# Patient Record
Sex: Male | Born: 1995 | Race: Black or African American | Hispanic: No | Marital: Single | Smoking: Current every day smoker
Health system: Southern US, Community
[De-identification: ages and names within clinical notes are randomized; demographics above are authoritative.]

## PROBLEM LIST (undated history)

## (undated) DIAGNOSIS — S62309A Unspecified fracture of unspecified metacarpal bone, initial encounter for closed fracture: Secondary | ICD-10-CM

---
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
## 2017-03-13 ENCOUNTER — Emergency Department (HOSPITAL_COMMUNITY): Payer: Self-pay

## 2017-03-13 ENCOUNTER — Emergency Department (HOSPITAL_COMMUNITY)
Admission: EM | Admit: 2017-03-13 | Discharge: 2017-03-13 | Disposition: A | Discharge status: Home / Self Care | Payer: Self-pay | Attending: Emergency Medicine | Admitting: Emergency Medicine

## 2017-03-13 ENCOUNTER — Encounter (HOSPITAL_COMMUNITY): Payer: Self-pay | Admitting: Emergency Medicine

## 2017-03-13 DIAGNOSIS — F17200 Nicotine dependence, unspecified, uncomplicated: Secondary | ICD-10-CM | POA: Insufficient documentation

## 2017-03-13 DIAGNOSIS — R072 Precordial pain: Secondary | ICD-10-CM | POA: Insufficient documentation

## 2017-03-13 LAB — CBC
HEMATOCRIT: 40.9 % (ref 39.0–52.0)
HEMOGLOBIN: 14.2 g/dL (ref 13.0–17.0)
MCH: 30.9 pg (ref 26.0–34.0)
MCHC: 34.7 g/dL (ref 30.0–36.0)
MCV: 89.1 fL (ref 78.0–100.0)
Platelets: 210 10*3/uL (ref 150–400)
RBC: 4.59 MIL/uL (ref 4.22–5.81)
RDW: 12.5 % (ref 11.5–15.5)
WBC: 5.2 10*3/uL (ref 4.0–10.5)

## 2017-03-13 LAB — BASIC METABOLIC PANEL
ANION GAP: 8 (ref 5–15)
BUN: 8 mg/dL (ref 6–20)
CHLORIDE: 106 mmol/L (ref 101–111)
CO2: 26 mmol/L (ref 22–32)
Calcium: 9.2 mg/dL (ref 8.9–10.3)
Creatinine, Ser: 0.93 mg/dL (ref 0.61–1.24)
GFR calc Af Amer: >60 mL/min (ref >60)
GLUCOSE: 86 mg/dL (ref 65–99)
POTASSIUM: 3.5 mmol/L (ref 3.5–5.1)
Sodium: 140 mmol/L (ref 135–145)

## 2017-03-13 LAB — I-STAT TROPONIN, ED: Troponin i, poc: 0 ng/mL (ref 0.00–0.08)
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
## 2017-03-13 NOTE — ED Provider Notes (Signed)
[facility]-EMERGENCY DEPT Provider Note   CSN: [id number] Arrival date & time: 03/13/17  0108     History   Chief Complaint Chief Complaint  Patient presents with  . Chest Pain  . Spasms    HPI Kerry Butler is a 21 y.o. male.  HPI Patient is a 21 year old male presents the emergency department after developing chest tightness tonight.  He states he's had chest tightness most days for the past year and half and is never been seen or evaluated for this.  He reports the pain is sharp and burning sensation in his chest with occasional radiation towards his right arm.  No shortness of breath.  No active chest pain this time.  Denies fevers and chills.  No productive cough.  No other complaints at this time.   Past Medical History:  Diagnosis Date  . Fracture of metacarpal 09/07/2013   right 2nd    There are no active problems to display for this patient.   Past Surgical History:  Procedure Laterality Date  . OPEN REDUCTION INTERNAL FIXATION (ORIF) METACARPAL Right 09/17/2013   Procedure: Open reduction internal fixation right second metacarpal fracture;  Surgeon: Jodi Marble, MD;  Location: [facility];  Service: Orthopedics;  Laterality: Right;       Home Medications    Prior to Admission medications   Medication Sig Start Date End Date Taking? Authorizing Provider  acetaminophen (TYLENOL) 325 MG tablet Take 650 mg by mouth every 6 (six) hours as needed for mild pain.     [provider]  ibuprofen (ADVIL,MOTRIN) 200 MG tablet Take 400 mg by mouth every 6 (six) hours as needed for pain.    [provider]  traMADol (ULTRAM) 50 MG tablet Take 1 tablet (50 mg total) by mouth every 12 (twelve) hours as needed. 10/09/15   Tomasita Crumble, MD    Family History Family History  Problem Relation Age of Onset  . Hypertension Maternal Grandmother     Social History Social History  Substance Use Topics  . Smoking status: Current Every Day  Smoker  . Smokeless tobacco: Never Used  . Alcohol use No     Allergies   Patient has no known allergies.   Review of Systems Review of Systems  All other systems reviewed and are negative.    Physical Exam Updated Vital Signs BP 118/77   Pulse 61   Temp 98.1 F (36.7 C) (Oral)   Resp 20   Ht  (1.778 m)   Wt 73.9 kg (163 lb)   SpO2 98%   BMI 23.39 kg/m   Physical Exam  Constitutional: He is oriented to person, place, and time. He appears well-developed and well-nourished.  HENT:  Head: Normocephalic and atraumatic.  Eyes: EOM are normal.  Neck: Normal range of motion.  Cardiovascular: Normal rate, regular rhythm and normal heart sounds.   Pulmonary/Chest: Effort normal and breath sounds normal. No respiratory distress.  Abdominal: Soft. He exhibits no distension. There is no tenderness.  Musculoskeletal: Normal range of motion.  Neurological: He is alert and oriented to person, place, and time.  Skin: Skin is warm and dry.  Psychiatric: He has a normal mood and affect. Judgment normal.  Nursing note and vitals reviewed.    ED Treatments / Results  Labs (all labs ordered are listed, but only abnormal results are displayed) Labs Reviewed  BASIC METABOLIC PANEL  CBC  I-STAT TROPONIN, ED    EKG  EKG Interpretation  Date/Time:  Sunday March 13 2017 01:22:14 EDT Ventricular Rate:  64 PR Interval:  128 QRS Duration: 82 QT Interval:  356 QTC Calculation: 367 R Axis:   77 Text Interpretation:  Sinus rhythm with marked sinus arrhythmia Otherwise normal ECG No old tracing to compare Confirmed by Dione Booze ([id number]) on 03/13/2017 1:30:11 AM       Radiology Dg Chest 2 View  Result Date: 03/13/2017 CLINICAL DATA:  Right-sided chest pain tonight. Shortness of breath. EXAM: CHEST  2 VIEW COMPARISON:  Radiographs and CT 10/09/2015 FINDINGS: The cardiomediastinal contours are normal. The lungs are clear. Pulmonary vasculature is normal. No  consolidation, pleural effusion, or pneumothorax. No acute osseous abnormalities are seen. IMPRESSION: Normal radiographs of the chest. Electronically Signed   By: Rubye Oaks M.D.   On: 03/13/2017 02:26    Procedures Procedures (including critical care time)  Medications Ordered in ED Medications - No data to display   Initial Impression / Assessment and Plan / ED Course  I have reviewed the triage vital signs and the nursing notes.  Pertinent labs & imaging results that were available during my care of the patient were reviewed by me and considered in my medical decision making (see chart for details).     Patient is overall well-appearing.  Patient is PERC negative.  Doubt PE.  Doubt ACS.  Likely esophageal or gastroesophageal reflux disease related.  Discharge home in good condition.  Primary care follow-up.  Recommended over-the-counter PPI daily for the next 30 days.  He understands return to the ER for new or worsening symptoms  Final Clinical Impressions(s) / ED Diagnoses   Final diagnoses:  Precordial chest pain    New Prescriptions New Prescriptions   No medications on file     Azalia Bilis, MD 03/13/17 0501
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
## 2017-09-16 ENCOUNTER — Emergency Department (HOSPITAL_COMMUNITY)
Admission: EM | Admit: 2017-09-16 | Discharge: 2017-09-16 | Disposition: A | Discharge status: Home / Self Care | Payer: Self-pay | Attending: Emergency Medicine | Admitting: Emergency Medicine

## 2017-09-16 ENCOUNTER — Other Ambulatory Visit: Payer: Self-pay

## 2017-09-16 ENCOUNTER — Encounter (HOSPITAL_COMMUNITY): Payer: Self-pay

## 2017-09-16 DIAGNOSIS — Z5321 Procedure and treatment not carried out due to patient leaving prior to being seen by health care provider: Secondary | ICD-10-CM | POA: Insufficient documentation

## 2017-09-16 DIAGNOSIS — R1110 Vomiting, unspecified: Secondary | ICD-10-CM | POA: Insufficient documentation

## 2017-09-16 DIAGNOSIS — R109 Unspecified abdominal pain: Secondary | ICD-10-CM | POA: Insufficient documentation

## 2017-09-16 LAB — CBC
HEMATOCRIT: 48.6 % (ref 39.0–52.0)
HEMOGLOBIN: 16.4 g/dL (ref 13.0–17.0)
MCH: 31.4 pg (ref 26.0–34.0)
MCHC: 33.7 g/dL (ref 30.0–36.0)
MCV: 92.9 fL (ref 78.0–100.0)
Platelets: 267 10*3/uL (ref 150–400)
RBC: 5.23 MIL/uL (ref 4.22–5.81)
RDW: 12.8 % (ref 11.5–15.5)
WBC: 8.7 10*3/uL (ref 4.0–10.5)

## 2017-09-16 LAB — COMPREHENSIVE METABOLIC PANEL
ALT: 34 U/L (ref 17–63)
ANION GAP: 10 (ref 5–15)
AST: 29 U/L (ref 15–41)
Albumin: 4.5 g/dL (ref 3.5–5.0)
Alkaline Phosphatase: 74 U/L (ref 38–126)
BILIRUBIN TOTAL: 0.8 mg/dL (ref 0.3–1.2)
BUN: 12 mg/dL (ref 6–20)
CO2: 26 mmol/L (ref 22–32)
Calcium: 9.5 mg/dL (ref 8.9–10.3)
Chloride: 103 mmol/L (ref 101–111)
Creatinine, Ser: 0.87 mg/dL (ref 0.61–1.24)
GFR calc Af Amer: >60 mL/min (ref >60)
GLUCOSE: 90 mg/dL (ref 65–99)
POTASSIUM: 3.9 mmol/L (ref 3.5–5.1)
Sodium: 139 mmol/L (ref 135–145)
TOTAL PROTEIN: 8.3 g/dL — AB (ref 6.5–8.1)

## 2017-09-16 LAB — LIPASE, BLOOD: Lipase: 23 U/L (ref 11–51)

## 2017-09-16 NOTE — ED Notes (Signed)
Pt changed his mind about leaving after receiving the automated text from [facility].

## 2017-09-16 NOTE — ED Notes (Signed)
Pt sts to [facility] that the wait is to long and he is going home
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
## 2017-09-16 NOTE — ED Notes (Signed)
Pt called twice from triage with no answer 

## 2018-12-26 ENCOUNTER — Encounter (HOSPITAL_COMMUNITY): Payer: Self-pay | Admitting: Emergency Medicine

## 2018-12-26 ENCOUNTER — Emergency Department (HOSPITAL_COMMUNITY)
Admission: EM | Admit: 2018-12-26 | Discharge: 2018-12-26 | Disposition: A | Discharge status: Home / Self Care | Payer: HRSA Program | Attending: Emergency Medicine | Admitting: Emergency Medicine

## 2018-12-26 ENCOUNTER — Other Ambulatory Visit: Payer: Self-pay

## 2018-12-26 DIAGNOSIS — F17210 Nicotine dependence, cigarettes, uncomplicated: Secondary | ICD-10-CM | POA: Insufficient documentation

## 2018-12-26 DIAGNOSIS — R51 Headache: Secondary | ICD-10-CM | POA: Insufficient documentation

## 2018-12-26 DIAGNOSIS — R112 Nausea with vomiting, unspecified: Secondary | ICD-10-CM | POA: Diagnosis not present

## 2018-12-26 DIAGNOSIS — R197 Diarrhea, unspecified: Secondary | ICD-10-CM | POA: Diagnosis not present

## 2018-12-26 DIAGNOSIS — R1084 Generalized abdominal pain: Secondary | ICD-10-CM | POA: Diagnosis not present

## 2018-12-26 DIAGNOSIS — Z20828 Contact with and (suspected) exposure to other viral communicable diseases: Secondary | ICD-10-CM | POA: Insufficient documentation

## 2018-12-26 DIAGNOSIS — Z20822 Contact with and (suspected) exposure to covid-19: Secondary | ICD-10-CM

## 2018-12-26 LAB — URINALYSIS, ROUTINE W REFLEX MICROSCOPIC
Bilirubin Urine: NEGATIVE
Glucose, UA: NEGATIVE mg/dL
Hgb urine dipstick: NEGATIVE
Ketones, ur: 5 mg/dL — AB
Leukocytes,Ua: NEGATIVE
Nitrite: NEGATIVE
Protein, ur: NEGATIVE mg/dL
Specific Gravity, Urine: 1.015 (ref 1.005–1.030)
pH: 6 (ref 5.0–8.0)

## 2018-12-26 LAB — CBC WITH DIFFERENTIAL/PLATELET
Abs Immature Granulocytes: 0.01 10*3/uL (ref 0.00–0.07)
Basophils Absolute: 0.1 10*3/uL (ref 0.0–0.1)
Basophils Relative: 2 %
Eosinophils Absolute: 0 10*3/uL (ref 0.0–0.5)
Eosinophils Relative: 0 %
HCT: 43 % (ref 39.0–52.0)
Hemoglobin: 14.7 g/dL (ref 13.0–17.0)
Immature Granulocytes: 0 %
Lymphocytes Relative: 44 %
Lymphs Abs: 1.4 10*3/uL (ref 0.7–4.0)
MCH: 31.3 pg (ref 26.0–34.0)
MCHC: 34.2 g/dL (ref 30.0–36.0)
MCV: 91.5 fL (ref 80.0–100.0)
Monocytes Absolute: 0.3 10*3/uL (ref 0.1–1.0)
Monocytes Relative: 9 %
Neutro Abs: 1.4 10*3/uL — ABNORMAL LOW (ref 1.7–7.7)
Neutrophils Relative %: 45 %
Platelets: 226 10*3/uL (ref 150–400)
RBC: 4.7 MIL/uL (ref 4.22–5.81)
RDW: 12.5 % (ref 11.5–15.5)
WBC: 3.1 10*3/uL — ABNORMAL LOW (ref 4.0–10.5)
nRBC: 0 % (ref 0.0–0.2)

## 2018-12-26 LAB — COMPREHENSIVE METABOLIC PANEL
ALT: 18 U/L (ref 0–44)
AST: 30 U/L (ref 15–41)
Albumin: 4.4 g/dL (ref 3.5–5.0)
Alkaline Phosphatase: 48 U/L (ref 38–126)
Anion gap: 9 (ref 5–15)
BUN: 10 mg/dL (ref 6–20)
CO2: 24 mmol/L (ref 22–32)
Calcium: 9.4 mg/dL (ref 8.9–10.3)
Chloride: 103 mmol/L (ref 98–111)
Creatinine, Ser: 0.99 mg/dL (ref 0.61–1.24)
GFR calc Af Amer: >60 mL/min (ref >60)
GFR calc non Af Amer: >60 mL/min (ref >60)
Glucose, Bld: 83 mg/dL (ref 70–99)
Potassium: 4.6 mmol/L (ref 3.5–5.1)
Sodium: 136 mmol/L (ref 135–145)
Total Bilirubin: 2 mg/dL — ABNORMAL HIGH (ref 0.3–1.2)
Total Protein: 7.2 g/dL (ref 6.5–8.1)

## 2018-12-26 LAB — LIPASE, BLOOD: Lipase: 23 U/L (ref 11–51)

## 2018-12-26 MED ORDER — ONDANSETRON 4 MG PO TBDP: 4.0000 mg | ORAL_TABLET | Freq: Three times a day (TID) | ORAL | 0 refills | Status: DC | PRN

## 2018-12-26 NOTE — ED Notes (Signed)
Patient verbalizes understanding of discharge instructions. Opportunity for questioning and answers were provided. Armband removed by staff, pt discharged from ED.  

## 2018-12-26 NOTE — Discharge Instructions (Addendum)
Lab work including electrolytes is normal.   I suspect you have a virus that is causing your symptoms.  We tested your for COVID-19 (coronavirus) infection because this could be the virus causing your symptoms.  It could also be any other virus.  COVID test results come back in 48 hours, sometimes sooner.  Someone will call you to notify you of results.  Until test results come back you need to do strict self isolation at home.    A virus is treated conservatively with symptom control, self-isolation, monitoring of symptoms and supportive care with over-the-counter medicines.  Use ondansetron (Zofran) for nausea. Use over the counter imodium to control and slow down diarrhea. Stay well hydrated.  Avoid irritating foods that cause more nausea, vomiting, diarrhea such as fatty, greasy or fried foods.   Return to the ED if there is increased work of breathing, shortness of breath, inability to tolerate fluids, weakness, chest pain.  If your test results are POSITIVE, the following isolation requirements need to be met to return to work and resume essential activities: At least 14 days since symptom onset  72 hours of absence of fever without antifever medicine (ibuprofen, acetaminophen). A fever is temperature of 100.50F or greater. Improvement of respiratory symptoms  If your test is NEGATIVE, you may return to work and essential activities as long as your symptoms have improved and you do not have a fever for a total of 3 days.  Call your job and notify them that your test result was negative to see if they will allow you to return to work.   Stay well-hydrated. Rest. You can use over the counter medications to help with symptoms: 600 mg ibuprofen (motrin, aleve, advil) or acetaminophen (tylenol) every 6 hours, around the clock to help with associated fevers, sore throat, headaches, generalized body aches and malaise.  Oxymetazoline (afrin) intranasal spray once daily for no more than 3 days to help  with congestion, after 3 days you can switch to another over-the-counter nasal steroid spray such as fluticasone (flonase) Allergy medication (loratadine, cetirizine, etc) and phenylephrine (sudafed) help with nasal congestion, runny nose and postnasal drip.   Dextromethorphan (Delsym) to suppress dry cough. Frequent coughing is likely causing your chest wall pain Wash your hands often to prevent spread.    Infection Prevention Recommendations for Individuals Confirmed to have, or Being Evaluated for, or have symptoms of 2019 Novel Coronavirus (COVID-19) Infection Who Receive Care at Home  Individuals who are confirmed to have, or are being evaluated for, COVID-19 should follow the prevention steps below until a healthcare provider or local or state health department says they can return to normal activities.  Stay home except to get medical care You should restrict activities outside your home, except for getting medical care. Do not go to work, school, or public areas, and do not use public transportation or taxis.  Call ahead before visiting your doctor Before your medical appointment, call the healthcare provider and tell them that you have, or are being evaluated for, COVID-19 infection. This will help the healthcare providers office take steps to keep other people from getting infected. Ask your healthcare provider to call the local or state health department.  Monitor your symptoms Seek prompt medical attention if your illness is worsening (e.g., difficulty breathing). Before going to your medical appointment, call the healthcare provider and tell them that you have, or are being evaluated for, COVID-19 infection. Ask your healthcare provider to call the local or state health  department.  Wear a facemask You should wear a facemask that covers your nose and mouth when you are in the same room with other people and when you visit a healthcare provider. People who live with or visit you  should also wear a facemask while they are in the same room with you.  Separate yourself from other people in your home As much as possible, you should stay in a different room from other people in your home. Also, you should use a separate bathroom, if available.  Avoid sharing household items You should not share dishes, drinking glasses, cups, eating utensils, towels, bedding, or other items with other people in your home. After using these items, you should wash them thoroughly with soap and water.  Cover your coughs and sneezes Cover your mouth and nose with a tissue when you cough or sneeze, or you can cough or sneeze into your sleeve. Throw used tissues in a lined trash can, and immediately wash your hands with soap and water for at least 20 seconds or use an alcohol-based hand rub.  Wash your [facility] your hands often and thoroughly with soap and water for at least 20 seconds. You can use an alcohol-based hand sanitizer if soap and water are not available and if your hands are not visibly dirty. Avoid touching your eyes, nose, and mouth with unwashed hands.   Prevention Steps for Caregivers and Household Members of Individuals Confirmed to have, or Being Evaluated for, or have symptoms of 2019 Novel Coronavirus (COVID-19) Infection Being Cared for in the Home  If you live with, or provide care at home for, a person confirmed to have, or being evaluated for, COVID-19 infection please follow these guidelines to prevent infection:  Follow healthcare providers instructions Make sure that you understand and can help the patient follow any healthcare provider instructions for all care.  Provide for the patients basic needs You should help the patient with basic needs in the home and provide support for getting groceries, prescriptions, and other personal needs.  Monitor the patients symptoms If they are getting sicker, call his or her medical provider and tell them that the patient  has, or is being evaluated for, COVID-19 infection. This will help the healthcare providers office take steps to keep other people from getting infected. Ask the healthcare provider to call the local or state health department.  Limit the number of people who have contact with the patient If possible, have only one caregiver for the patient. Other household members should stay in another home or place of residence. If this is not possible, they should stay in another room, or be separated from the patient as much as possible. Use a separate bathroom, if available. Restrict visitors who do not have an essential need to be in the home.  Keep older adults, very young children, and other sick people away from the patient Keep older adults, very young children, and those who have compromised immune systems or chronic health conditions away from the patient. This includes people with chronic heart, lung, or kidney conditions, diabetes, and cancer.  Ensure good ventilation Make sure that shared spaces in the home have good air flow, such as from an air conditioner or an opened window, weather permitting.  Wash your hands often Wash your hands often and thoroughly with soap and water for at least 20 seconds. You can use an alcohol based hand sanitizer if soap and water are not available and if your hands are  not visibly dirty. Avoid touching your eyes, nose, and mouth with unwashed hands. Use disposable paper towels to dry your hands. If not available, use dedicated cloth towels and replace them when they become wet.  Wear a facemask and gloves Wear a disposable facemask at all times in the room and gloves when you touch or have contact with the patients blood, body fluids, and/or secretions or excretions, such as sweat, saliva, sputum, nasal mucus, vomit, urine, or feces.  Ensure the mask fits over your nose and mouth tightly, and do not touch it during use. Throw out disposable facemasks and gloves  after using them. Do not reuse. Wash your hands immediately after removing your facemask and gloves. If your personal clothing becomes contaminated, carefully remove clothing and launder. Wash your hands after handling contaminated clothing. Place all used disposable facemasks, gloves, and other waste in a lined container before disposing them with other household waste. Remove gloves and wash your hands immediately after handling these items.  Do not share dishes, glasses, or other household items with the patient Avoid sharing household items. You should not share dishes, drinking glasses, cups, eating utensils, towels, bedding, or other items with a patient who is confirmed to have, or being evaluated for, COVID-19 infection. After the person uses these items, you should wash them thoroughly with soap and water.  Wash laundry thoroughly Immediately remove and wash clothes or bedding that have blood, body fluids, and/or secretions or excretions, such as sweat, saliva, sputum, nasal mucus, vomit, urine, or feces, on them. Wear gloves when handling laundry from the patient. Read and follow directions on labels of laundry or clothing items and detergent. In general, wash and dry with the warmest temperatures recommended on the label.  Clean all areas the individual has used often Clean all touchable surfaces, such as counters, tabletops, doorknobs, bathroom fixtures, toilets, phones, keyboards, tablets, and bedside tables, every day. Also, clean any surfaces that may have blood, body fluids, and/or secretions or excretions on them. Wear gloves when cleaning surfaces the patient has come in contact with. Use a diluted bleach solution (e.g., dilute bleach with 1 part bleach and 10 parts water) or a household disinfectant with a label that says EPA-registered for coronaviruses. To make a bleach solution at home, add 1 tablespoon of bleach to 1 quart (4 cups) of water. For a larger supply, add  cup of  bleach to 1 gallon (16 cups) of water. Read labels of cleaning products and follow recommendations provided on product labels. Labels contain instructions for safe and effective use of the cleaning product including precautions you should take when applying the product, such as wearing gloves or eye protection and making sure you have good ventilation during use of the product. Remove gloves and wash hands immediately after cleaning.  Monitor yourself for signs and symptoms of illness Caregivers and household members are considered close contacts, should monitor their health, and will be asked to limit movement outside of the home to the extent possible. Follow the monitoring steps for close contacts listed on the symptom monitoring form.  ? If you have additional questions, contact your local health department or call the epidemiologist on call at [phone] (available 24/7). ? This guidance is subject to change. For the most up-to-date guidance from [facility], please refer to their website: [email or web address]

## 2018-12-26 NOTE — ED Triage Notes (Signed)
Pt arrives to ED from home with complaints of nausea, diarrhea, headache and fever for the last week. Pt has come into the ED today for a COVID test for work.

## 2018-12-26 NOTE — ED Provider Notes (Signed)
[facility] Provider Note   CSN: [id number] Arrival date & time: 12/26/18  1029    History   Chief Complaint Chief Complaint  Patient presents with  . Headache  . Diarrhea    HPI Kerry Butler is a 23 y.o. male with no PMH presents to the ER for evaluation of diarrhea. Onset 7 days ago.  Described as loose, watery, nonbloody, non-melanotic.  He is having 4-6 episodes daily.  States he drank 14 cups of water today and he just pooped it out.  Initially he had associated generalized abdominal cramping which has resolved.  For the last week he has had fever of 101, intermittent generalized mild headaches, rhinorrhea that he attributes to wearing a mask, nausea and vomiting that began today.  He is only vomited once, nonbloody, no coffee-ground emesis.  He lives with his mother and mother's husband who are well and have no symptoms.  He has 2 jobs.  He works at a [employer] and also at [employer] and states that they get a lot of shipment from [place].  Over the last few weeks he has been advised that some coworkers have been tested positive for COVID-19.  He has continued to go to work despite symptoms.  He is fasting for religious reasons for the last [id number], and breaks fast at 8 PM.  He denies any significant diet changes otherwise.  No recent travel.  No recent antibiotics.  No exposure to suspicious, undercooked foods.  No interventions other than trying to stay hydrated.  He has been able to drink water today without vomiting.  No history of abdominal surgeries.  He denies congestion, sore throat, cough, chest pain, shortness of breath, abdominal pain, dysuria, loss of smell or taste.     HPI  Past Medical History:  Diagnosis Date  . Fracture of metacarpal 09/07/2013   right 2nd    There are no active problems to display for this patient.   Past Surgical History:  Procedure Laterality Date  . OPEN REDUCTION INTERNAL FIXATION  (ORIF) METACARPAL Right 09/17/2013   Procedure: Open reduction internal fixation right second metacarpal fracture;  Surgeon: Jodi Marbleavid A Thompson, MD;  Location: [facility];  Service: Orthopedics;  Laterality: Right;        Home Medications    Prior to Admission medications   Medication Sig Start Date End Date Taking? Authorizing Provider  acetaminophen (TYLENOL) 325 MG tablet Take 650 mg by mouth every 6 (six) hours as needed for mild pain.     [provider]  ibuprofen (ADVIL,MOTRIN) 200 MG tablet Take 400 mg by mouth every 6 (six) hours as needed for pain.    [provider]  ondansetron (ZOFRAN ODT) 4 MG disintegrating tablet Take 1 tablet (4 mg total) by mouth every 8 (eight) hours as needed for nausea or vomiting. 12/26/18   Liberty HandyGibbons, Emali Heyward J, PA-C  traMADol (ULTRAM) 50 MG tablet Take 1 tablet (50 mg total) by mouth every 12 (twelve) hours as needed. 10/09/15   Tomasita Crumbleni, Adeleke, MD    Family History Family History  Problem Relation Age of Onset  . Hypertension Maternal Grandmother     Social History Social History   Tobacco Use  . Smoking status: Current Every Day Smoker    Packs/day: 0.15    Types: Cigarettes  . Smokeless tobacco: Never Used  Substance Use Topics  . Alcohol use: No  . Drug use: Not Currently  Types: Marijuana     Allergies   Patient has no known allergies.   Review of Systems Review of Systems  Constitutional: Positive for fever.  HENT: Positive for rhinorrhea.   Gastrointestinal: Positive for abdominal pain (resolved), diarrhea, nausea and vomiting (x1 ).  Neurological: Positive for headaches.  All other systems reviewed and are negative.    Physical Exam Updated Vital Signs BP 115/72 (BP Location: Right Arm)   Pulse (!) 55   Temp 98.4 F (36.9 C) (Oral)   Resp 16   SpO2 100%   Physical Exam Vitals signs and nursing note reviewed.  Constitutional:      Appearance: He is well-developed.     Comments:  Non toxic.  HENT:     Head: Normocephalic and atraumatic.     Nose: Nose normal.     Comments: No nasal mucosal congestion, erythema, edema, rhinorrhea.    Mouth/Throat:     Comments: Moist mucous membranes.  Oropharynx and tonsils normal. Eyes:     Conjunctiva/sclera: Conjunctivae normal.  Neck:     Musculoskeletal: Normal range of motion.  Cardiovascular:     Rate and Rhythm: Normal rate and regular rhythm.     Heart sounds: Normal heart sounds.  Pulmonary:     Effort: Pulmonary effort is normal.     Breath sounds: Normal breath sounds.  Abdominal:     General: Bowel sounds are normal.     Palpations: Abdomen is soft.     Tenderness: There is no abdominal tenderness.     Comments: No G/R/R. No suprapubic or CVA tenderness. Negative Murphy's and McBurney's  Musculoskeletal: Normal range of motion.  Skin:    General: Skin is warm and dry.     Capillary Refill: Capillary refill takes less than 2 seconds.  Neurological:     Mental Status: He is alert.  Psychiatric:        Behavior: Behavior normal.      ED Treatments / Results  Labs (all labs ordered are listed, but only abnormal results are displayed) Labs Reviewed  CBC WITH DIFFERENTIAL/PLATELET - Abnormal; Notable for the following components:      Result Value   WBC 3.1 (*)    Neutro Abs 1.4 (*)    All other components within normal limits  COMPREHENSIVE METABOLIC PANEL - Abnormal; Notable for the following components:   Total Bilirubin 2.0 (*)    All other components within normal limits  URINALYSIS, ROUTINE W REFLEX MICROSCOPIC - Abnormal; Notable for the following components:   Ketones, ur 5 (*)    All other components within normal limits  NOVEL CORONAVIRUS, NAA (HOSPITAL ORDER, SEND-OUT TO REF LAB)  LIPASE, BLOOD    EKG None  Radiology No results found.  Procedures Procedures (including critical care time)  Medications Ordered in ED Medications - No data to display   Initial Impression /  Assessment and Plan / ED Course  I have reviewed the triage vital signs and the nursing notes.  Pertinent labs & imaging results that were available during my care of the patient were reviewed by me and considered in my medical decision making (see chart for details).  I reviewed patient's EMR to obtain pertinent PMH.  DDX includes viral illness vs infectious diarrhea.  States he works at a [employer] that gets a lot of shipments from [place], [place] and has had several coworkers test positive for COVID-19 so COVID-19 high on ddx.  Given his work setting, known sick contacts, I think it  will be beneficial to do 2 day send out COVID test today. He has no fever here, abdominal tenderness, signs of significant dehydration and acute intraabdominal emergency like appendicitis, cholecystitis, pancreatitis, SBO less likely. He has no h/o abdominal surgeries. Abd exam benign here. Given 7 days of diarrhea, will check screening labs to rule out significant electrolyte abnormalities. Tolerating PO, MMM, we will hydrate orally. Pending labs.   Lab works reviewed and interpreted by me. 5 ketones in urine likely from mild dehydration. I have re-evaluated patient. He is tolerating PO challenge here. No episodes of emesis or diarrhea in ER. VSS.   Will dc with supportive tx for likely viral illness, pending COVID test.  Discussed isolation instructions, supportive care at home and return precautions. Pt comfortable with ER work up and discharge plan.   Final Clinical Impressions(s) / ED Diagnoses   Final diagnoses:  Diarrhea of presumed infectious origin  Suspected Covid-19 Virus Infection    ED Discharge Orders         Ordered    ondansetron (ZOFRAN ODT) 4 MG disintegrating tablet  Every 8 hours PRN     12/26/18 1134           Jerrell MylarGibbons, Kanon Novosel J, PA-C 12/26/18 1303    Linwood DibblesKnapp, Jon, MD 12/28/18 [phone]

## 2018-12-27 LAB — NOVEL CORONAVIRUS, NAA (HOSP ORDER, SEND-OUT TO REF LAB; TAT 18-24 HRS): SARS-CoV-2, NAA: NOT DETECTED

## 2019-03-18 ENCOUNTER — Emergency Department (HOSPITAL_COMMUNITY)
Admission: EM | Admit: 2019-03-18 | Discharge: 2019-03-18 | Disposition: A | Discharge status: Home / Self Care | Payer: Self-pay | Attending: Emergency Medicine | Admitting: Emergency Medicine

## 2019-03-18 ENCOUNTER — Encounter (HOSPITAL_COMMUNITY): Payer: Self-pay

## 2019-03-18 ENCOUNTER — Other Ambulatory Visit: Payer: Self-pay

## 2019-03-18 DIAGNOSIS — Z20828 Contact with and (suspected) exposure to other viral communicable diseases: Secondary | ICD-10-CM | POA: Insufficient documentation

## 2019-03-18 DIAGNOSIS — R059 Cough, unspecified: Secondary | ICD-10-CM

## 2019-03-18 DIAGNOSIS — F17210 Nicotine dependence, cigarettes, uncomplicated: Secondary | ICD-10-CM | POA: Insufficient documentation

## 2019-03-18 DIAGNOSIS — R0981 Nasal congestion: Secondary | ICD-10-CM | POA: Insufficient documentation

## 2019-03-18 DIAGNOSIS — M7918 Myalgia, other site: Secondary | ICD-10-CM | POA: Insufficient documentation

## 2019-03-18 DIAGNOSIS — R05 Cough: Secondary | ICD-10-CM | POA: Insufficient documentation

## 2019-03-18 DIAGNOSIS — J029 Acute pharyngitis, unspecified: Secondary | ICD-10-CM | POA: Insufficient documentation

## 2019-03-18 MED ORDER — FLUTICASONE PROPIONATE 50 MCG/ACT NA SUSP: 2.0000 | Freq: Every day | NASAL | 0 refills | Status: DC

## 2019-03-18 MED ORDER — ACETAMINOPHEN 500 MG PO TABS: 500.0000 mg | ORAL_TABLET | Freq: Four times a day (QID) | ORAL | 0 refills | Status: DC | PRN

## 2019-03-18 MED ORDER — PHENOL 1.4 % MT LIQD: 1.0000 | OROMUCOSAL | 0 refills | Status: DC | PRN

## 2019-03-18 NOTE — ED Triage Notes (Signed)
Patient complains of 1 day of cough, congestion and headache, alert and oriented, NAD

## 2019-03-18 NOTE — ED Notes (Signed)
Patient verbalizes understanding of discharge instructions. Opportunity for questioning and answers were provided. Armband removed by staff, pt discharged from ED. Ambulated out to lobby  

## 2019-03-18 NOTE — Discharge Instructions (Signed)
Continue to stay well-hydrated.  Drink plenty fluids and get plenty of rest.  Gargle warm salt water and spit it out for sore throat. May also use cough drops, warm teas, Chloraseptic spray, etc. Take fluticasone to decrease nasal congestion. You can take 1 to 2 tablets of Tylenol (350mg -1000mg  depending on the dose) every 6 hours as needed for pain or fever.  Do not exceed 4000 mg of Tylenol daily.  If your pain persists you can take a doses of ibuprofen in between doses of Tylenol.  I usually recommend 400 to 600 mg of ibuprofen every 6 hours.  Take this with food to avoid upset stomach issues.   Followup with your primary care doctor in 5-7 days for recheck of ongoing symptoms. Return to emergency department for emergent changing or worsening of symptoms such as throat tightness, facial swelling, fever not controlled by ibuprofen or Tylenol,difficulty breathing, or chest pain.  Call the number on the back of your insurance card or go online to see which doctors are in your network and taking new patients and set up an appointment by calling their offices.  Your COVID test should result in the next 48 hours or so.  If negative, call your employer for further recommendations on when to return to work.  If positive, you will need to stay at home for at least 10 days since symptom onset at least 3 days fever free without the use of ibuprofen or Tylenol.

## 2019-03-18 NOTE — ED Provider Notes (Signed)
[facility] ([facility]) EMERGENCY DEPARTMENT Provider Note   CSN: [id number] Arrival date & time: 03/18/19  0907     History   Chief Complaint No chief complaint on file.   HPI Kerry Butler is Butler 23 y.o. male presents for evaluation of acute onset, persistent nasal congestion, cough, generalized myalgias, sore throat for 1 day.  Symptoms began yesterday.  Reports sore throat has improved.  Denies drooling.  Cough productive of clear sputum.  No fevers at home.  Denies shortness of breath.  Notes aching pain with cough to the chest but otherwise no chest pain.  No abdominal pain, nausea, or vomiting.  Has been taking over-the-counter medications with some relief.  Has had many positive COVID exposures at work though reports that they have all been wearing the appropriate PPE.  Current smoker of Butler few cigarettes daily.     The history is provided by the patient.    Past Medical History:  Diagnosis Date  . Fracture of metacarpal 09/07/2013   right 2nd    There are no active problems to display for this patient.   Past Surgical History:  Procedure Laterality Date  . OPEN REDUCTION INTERNAL FIXATION (ORIF) METACARPAL Right 09/17/2013   Procedure: Open reduction internal fixation right second metacarpal fracture;  Surgeon: Kerry Marble, MD;  Location: [facility];  Service: Orthopedics;  Laterality: Right;        Home Medications    Prior to Admission medications   Medication Sig Start Date End Date Taking? Authorizing Provider  acetaminophen (TYLENOL) 500 MG tablet Take 1 tablet (500 mg total) by mouth every 6 (six) hours as needed. 03/18/19   Kerry Sher A, PA-C  fluticasone (FLONASE) 50 MCG/ACT nasal spray Place 2 sprays into both nostrils daily. 03/18/19   Kerry Wickes A, PA-C  ibuprofen (ADVIL,MOTRIN) 200 MG tablet Take 400 mg by mouth every 6 (six) hours as needed for pain.    [provider]  ondansetron (ZOFRAN ODT) 4 MG  disintegrating tablet Take 1 tablet (4 mg total) by mouth every 8 (eight) hours as needed for nausea or vomiting. 12/26/18   Kerry Handy, PA-C  phenol (CHLORASEPTIC) 1.4 % LIQD Use as directed 1 spray in the mouth or throat as needed for throat irritation / pain. 03/18/19   Kerry Giammona A, PA-C  traMADol (ULTRAM) 50 MG tablet Take 1 tablet (50 mg total) by mouth every 12 (twelve) hours as needed. 10/09/15   Kerry Crumble, MD    Family History Family History  Problem Relation Age of Onset  . Hypertension Maternal Grandmother     Social History Social History   Tobacco Use  . Smoking status: Current Every Day Smoker    Packs/day: 0.15    Types: Cigarettes  . Smokeless tobacco: Never Used  Substance Use Topics  . Alcohol use: No  . Drug use: Not Currently    Types: Marijuana     Allergies   Patient has no known allergies.   Review of Systems Review of Systems  Constitutional: Negative for chills and fever.  HENT: Positive for congestion and sore throat.   Respiratory: Positive for cough. Negative for shortness of breath.   Cardiovascular: Positive for chest pain (with cough only).  Musculoskeletal: Positive for myalgias.  All other systems reviewed and are negative.    Physical Exam Updated Vital Signs BP 113/71 (BP Location: Right Arm)   Pulse 82   Temp 98.7 F (37.1 C) (Oral)  Resp 16   Ht 5\' 10"  (1.778 m)   Wt 68 kg   SpO2 97%   BMI 21.52 kg/m   Physical Exam Vitals signs and nursing note reviewed.  Constitutional:      General: He is not in acute distress.    Appearance: He is well-developed.  HENT:     Head: Normocephalic and atraumatic.     Nose: Congestion present.     Comments: No frontal or maxillary sinus tenderness    Mouth/Throat:     Mouth: Mucous membranes are moist.     Pharynx: Posterior oropharyngeal erythema present.     Comments: Mild posterior oropharyngeal erythema.  No tonsillar hypertrophy, exudates, uvular deviation, trismus,  or sublingual abnormalities.  Tolerating secretions without difficulty. Eyes:     General:        Right eye: No discharge.        Left eye: No discharge.     Conjunctiva/sclera: Conjunctivae normal.  Neck:     Musculoskeletal: Normal range of motion and neck supple. No neck rigidity.     Vascular: No JVD.     Trachea: No tracheal deviation.  Cardiovascular:     Rate and Rhythm: Normal rate and regular rhythm.     Pulses: Normal pulses.     Heart sounds: Normal heart sounds.  Pulmonary:     Effort: Pulmonary effort is normal.     Breath sounds: Normal breath sounds.     Comments: Speaking in full sentences without difficulty. Abdominal:     General: There is no distension.     Tenderness: There is no abdominal tenderness. There is no guarding or rebound.  Lymphadenopathy:     Cervical: No cervical adenopathy.  Skin:    General: Skin is warm and dry.     Findings: No erythema.  Neurological:     Mental Status: He is alert.  Psychiatric:        Behavior: Behavior normal.      ED Treatments / Results  Labs (all labs ordered are listed, but only abnormal results are displayed) Labs Reviewed  NOVEL CORONAVIRUS, NAA (HOSP ORDER, SEND-OUT TO REF LAB; TAT 18-24 HRS)    EKG None  Radiology No results found.  Procedures Procedures (including critical care time)  Medications Ordered in ED Medications - No data to display   Initial Impression / Assessment and Plan / ED Course  I have reviewed the triage vital signs and the nursing notes.  Pertinent labs & imaging results that were available during my care of the patient were reviewed by me and considered in my medical decision making (see chart for details).         Kerry Butler was evaluated in Emergency Department on 03/18/2019 for the symptoms described in the history of present illness. He was evaluated in the context of the global COVID-19 pandemic, which necessitated consideration that the patient  might be at risk for infection with the SARS-CoV-2 virus that causes COVID-19. Institutional protocols and algorithms that pertain to the evaluation of patients at risk for COVID-19 are in [place] state of rapid change based on information released by regulatory bodies including the CDC and federal and state organizations. These policies and algorithms were followed during the patient's care in the ED.  Patient presenting for evaluation of viral type symptoms since yesterday.  He is afebrile, vital signs are stable.  He is nontoxic in appearance.  Lungs clear to auscultation bilaterally with no adventitious breath sounds and he  has no signs of respiratory distress.  No evidence of strep pharyngitis, retropharyngeal abscess, or deep space neck infection.  No meningeal signs.  Overall resting comfortably in no apparent distress.  Will obtain outpatient COVID swab.  Suspect viral URI with cough.  Discussed symptomatic management and recommendations to quarantine at home per current CDC guidelines.  Recommend follow with PCP for reevaluation of symptoms.  Discussed strict ED return precautions. Patient and father verbalized understanding of and agreement with plan and is safe for discharge home at this time.    Final Clinical Impressions(s) / ED Diagnoses   Final diagnoses:  Nasal congestion  Acute sore throat  Cough    ED Discharge Orders         Ordered    fluticasone (FLONASE) 50 MCG/ACT nasal spray  Daily     03/18/19 1211    phenol (CHLORASEPTIC) 1.4 % LIQD  As needed     03/18/19 1211    acetaminophen (TYLENOL) 500 MG tablet  Every 6 hours PRN     03/18/19 1211           Jeanie SewerFawze, Journee Kohen A, PA-C 03/18/19 1213    Sabas SousBero, Michael M, MD 03/23/19 1558

## 2019-03-19 LAB — NOVEL CORONAVIRUS, NAA (HOSP ORDER, SEND-OUT TO REF LAB; TAT 18-24 HRS): SARS-CoV-2, NAA: NOT DETECTED

## 2020-04-11 ENCOUNTER — Encounter (HOSPITAL_COMMUNITY): Payer: Self-pay

## 2020-04-11 ENCOUNTER — Ambulatory Visit (HOSPITAL_COMMUNITY)
Admission: EM | Admit: 2020-04-11 | Discharge: 2020-04-11 | Disposition: A | Discharge status: Home / Self Care | Payer: HRSA Program | Attending: Family Medicine | Admitting: Family Medicine

## 2020-04-11 ENCOUNTER — Other Ambulatory Visit: Payer: Self-pay

## 2020-04-11 DIAGNOSIS — U071 COVID-19: Secondary | ICD-10-CM | POA: Diagnosis not present

## 2020-04-11 DIAGNOSIS — J00 Acute nasopharyngitis [common cold]: Secondary | ICD-10-CM | POA: Insufficient documentation

## 2020-04-11 LAB — RESPIRATORY PANEL BY RT PCR (FLU A&B, COVID)
Influenza A by PCR: NEGATIVE
Influenza B by PCR: NEGATIVE
SARS Coronavirus 2 by RT PCR: POSITIVE — AB

## 2020-04-11 NOTE — ED Triage Notes (Signed)
Pt present cough, chills with loss of taste. Symptoms started on Tuesday.

## 2020-04-11 NOTE — ED Provider Notes (Signed)
[facility]    CSN: [id number] Arrival date & time: 04/11/20  1806    History   Chief Complaint Chief Complaint  Patient presents with  . Chills  . Cough    HPI Kerry Butler is a very pleasant 24 y.o. male presenting to urgent care with reports that since Monday he has had a headache, body aches, cough, chills, change in taste, and diarrhea.  He also reports fatigue, stuffy nose, runny nose.  Denies any nausea, vomiting, shortness of breath, sore throat, and rashes.  He is unvaccinated against COVID.  HPI  Past Medical History:  Diagnosis Date  . Fracture of metacarpal 09/07/2013   right 2nd    There are no problems to display for this patient.   Past Surgical History:  Procedure Laterality Date  . OPEN REDUCTION INTERNAL FIXATION (ORIF) METACARPAL Right 09/17/2013   Procedure: Open reduction internal fixation right second metacarpal fracture;  Surgeon: Jodi Marble, MD;  Location: [facility];  Service: Orthopedics;  Laterality: Right;    Home Medications    Prior to Admission medications   Medication Sig Start Date End Date Taking? Authorizing Provider  acetaminophen (TYLENOL) 500 MG tablet Take 1 tablet (500 mg total) by mouth every 6 (six) hours as needed. 03/18/19   Fawze, Mina A, PA-C  fluticasone (FLONASE) 50 MCG/ACT nasal spray Place 2 sprays into both nostrils daily. 03/18/19   Fawze, Mina A, PA-C  ibuprofen (ADVIL,MOTRIN) 200 MG tablet Take 400 mg by mouth every 6 (six) hours as needed for pain.    [provider]  ondansetron (ZOFRAN ODT) 4 MG disintegrating tablet Take 1 tablet (4 mg total) by mouth every 8 (eight) hours as needed for nausea or vomiting. 12/26/18   Liberty Handy, PA-C  phenol (CHLORASEPTIC) 1.4 % LIQD Use as directed 1 spray in the mouth or throat as needed for throat irritation / pain. 03/18/19   Fawze, Mina A, PA-C  traMADol (ULTRAM) 50 MG tablet Take 1 tablet (50 mg total) by mouth every 12  (twelve) hours as needed. 10/09/15   Tomasita Crumble, MD    Family History Family History  Problem Relation Age of Onset  . Hypertension Maternal Grandmother     Social History Social History   Tobacco Use  . Smoking status: Current Every Day Smoker    Packs/day: 0.15    Types: Cigarettes  . Smokeless tobacco: Never Used  Vaping Use  . Vaping Use: Never used  Substance Use Topics  . Alcohol use: No  . Drug use: Not Currently    Types: Marijuana     Allergies   Patient has no known allergies.   Review of Systems Review of Systems   Physical Exam Triage Vital Signs ED Triage Vitals  Enc Vitals Group     BP 04/11/20 1904 (!) 117/58     Pulse Rate 04/11/20 1904 77     Resp 04/11/20 1904 16     Temp 04/11/20 1904 99 F (37.2 C)     Temp Source 04/11/20 1904 Oral     SpO2 04/11/20 1904 99 %     Weight --      Height --      Head Circumference --      Peak Flow --      Pain Score 04/11/20 1902 0     Pain Loc --      Pain Edu? --      Excl. in GC? --  No data found.  Updated Vital Signs BP (!) 117/58 (BP Location: Right Arm)   Pulse 77   Temp 99 F (37.2 C) (Oral)   Resp 16   SpO2 99%   Physical exam: General: appears fatigued, slightly uncomfortable due to chills, body aches, nontoxic appearing HEENT: erythematous and edematous nasal turbinates, mild pharyngitis, mild LAD, no pharyngeal or tonsillar exudates Respiratory: CTA bilaterally, moving air well Cardio: RRR S1S2 present, no murmurs appreciated Abdomen: normal bowel sounds appreciated Integumentary: no rashes appreciated   UC Treatments / Results  Labs (all labs ordered are listed, but only abnormal results are displayed) Labs Reviewed - No data to display  EKG   Radiology No results found.  Procedures Procedures (including critical care time)  Medications Ordered in UC Medications - No data to display  Initial Impression / Assessment and Plan / UC Course  I have reviewed the  triage vital signs and the nursing notes.  Pertinent labs & imaging results that were available during my care of the patient were reviewed by me and considered in my medical decision making (see chart for details).   COVID Positive: patient swabbed for COVID, Flu A and Flu B, ended up testing positive for COVID. Mild symptoms, satting well on Room Air. Patient is stable to discharge home with symptomatic care. Patient given the following instructions: 1. Use supportive treatment at home with Tylenol, Ibuprofen, hot tea, Mucinex, etc.  2. IF you develop worsening symptoms such as fevers that do not reduce with tylenol or shortness of breath, please go seek emergency medical care.  3. Please follow up with your primary care provider for this visit.  4. Please get your COVID and Flu vaccines as soon as possible  Final Clinical Impressions(s) / UC Diagnoses   Final diagnoses:  None   Discharge Instructions   None    ED Prescriptions    None     PDMP not reviewed this encounter.     Peggyann Shoals, DO [facility], PGY-3 04/11/2020 8:10 PM    Dollene Cleveland, DO 04/14/20 [phone]

## 2020-04-11 NOTE — Discharge Instructions (Addendum)
Thank you for coming in to see [place] today! Please see below to review our plan for today's visit:  1. Use supportive treatment at home with Tylenol, Ibuprofen, hot tea, Mucinex, etc.  2. IF you develop worsening symptoms such as fevers that do not reduce with tylenol or shortness of breath, please go seek emergency medical care.  3. Please follow up with your primary care provider for this visit.   Please get your COVID and Flu vaccines as soon as possible!!!  Please look up Dr. Pincus Badder with [facility] (chiropractor)  It was our pleasure to serve you!   Dr. Peggyann Shoals [facility]

## 2020-04-14 DIAGNOSIS — U071 COVID-19: Secondary | ICD-10-CM | POA: Diagnosis present

## 2022-06-05 ENCOUNTER — Ambulatory Visit (HOSPITAL_COMMUNITY)
Admission: EM | Admit: 2022-06-05 | Discharge: 2022-06-05 | Disposition: A | Discharge status: Home / Self Care | Payer: Self-pay

## 2022-06-05 ENCOUNTER — Ambulatory Visit
Admission: EM | Admit: 2022-06-05 | Discharge: 2022-06-05 | Disposition: A | Discharge status: Home / Self Care | Payer: Medicaid Other | Attending: Internal Medicine | Admitting: Internal Medicine

## 2022-06-05 DIAGNOSIS — J039 Acute tonsillitis, unspecified: Secondary | ICD-10-CM | POA: Diagnosis not present

## 2022-06-05 DIAGNOSIS — Z113 Encounter for screening for infections with a predominantly sexual mode of transmission: Secondary | ICD-10-CM | POA: Diagnosis not present

## 2022-06-05 LAB — POCT RAPID STREP A (OFFICE): Rapid Strep A Screen: NEGATIVE

## 2022-06-05 MED ORDER — CLINDAMYCIN HCL 300 MG PO CAPS: 300.0000 mg | ORAL_CAPSULE | Freq: Three times a day (TID) | ORAL | 0 refills | Status: AC

## 2022-06-05 NOTE — ED Triage Notes (Addendum)
Pt c/o pain to tongue, right ear, right side of neck x 3 days-pt also requesting STD screening-denies penile d/c and dysuria -NAD-steady gait

## 2022-06-05 NOTE — Discharge Instructions (Signed)
Take medication as prescribed  Lab results will be available via mychart in 2 - 5 days

## 2022-06-05 NOTE — ED Provider Notes (Signed)
[facility]    CSN: [id number] Arrival date & time: 06/05/22  1419      History   Chief Complaint No chief complaint on file.   HPI Kerry Butler is a 26 y.o. male.   Patient here concerned with sore throat x 2 days. Pain worsening, R side posterior, now radiation to ear. Some pain with swallowing.  No f/c, URI sx, cough.  No advil or tylenol today.  He is also requesting STD testing, he states someone he was with reports she had CT. He deneis any sx.    Past Medical History:  Diagnosis Date   Fracture of metacarpal 09/07/2013   right 2nd    Patient Active Problem List   Diagnosis Date Noted   Lab test positive for detection of COVID-19 virus 04/14/2020   Acute nasopharyngitis 04/11/2020    Past Surgical History:  Procedure Laterality Date   OPEN REDUCTION INTERNAL FIXATION (ORIF) METACARPAL Right 09/17/2013   Procedure: Open reduction internal fixation right second metacarpal fracture;  Surgeon: Jodi Marble, MD;  Location: [facility];  Service: Orthopedics;  Laterality: Right;       Home Medications    Prior to Admission medications   Medication Sig Start Date End Date Taking? Authorizing Provider  clindamycin (CLEOCIN) 300 MG capsule Take 1 capsule (300 mg total) by mouth 3 (three) times daily for 7 days. 06/05/22 06/12/22 Yes Evern Core, PA-C  acetaminophen (TYLENOL) 500 MG tablet Take 1 tablet (500 mg total) by mouth every 6 (six) hours as needed. 03/18/19   Fawze, Mina A, PA-C  fluticasone (FLONASE) 50 MCG/ACT nasal spray Place 2 sprays into both nostrils daily. 03/18/19   Fawze, Mina A, PA-C  ibuprofen (ADVIL,MOTRIN) 200 MG tablet Take 400 mg by mouth every 6 (six) hours as needed for pain.    [provider]  ondansetron (ZOFRAN ODT) 4 MG disintegrating tablet Take 1 tablet (4 mg total) by mouth every 8 (eight) hours as needed for nausea or vomiting. 12/26/18   Liberty Handy, PA-C  phenol (CHLORASEPTIC) 1.4  % LIQD Use as directed 1 spray in the mouth or throat as needed for throat irritation / pain. 03/18/19   Fawze, Mina A, PA-C  traMADol (ULTRAM) 50 MG tablet Take 1 tablet (50 mg total) by mouth every 12 (twelve) hours as needed. 10/09/15   Tomasita Crumble, MD    Family History Family History  Problem Relation Age of Onset   Hypertension Maternal Grandmother     Social History Social History   Tobacco Use   Smoking status: Every Day    Packs/day: 0.15    Types: Cigarettes   Smokeless tobacco: Never  Vaping Use   Vaping Use: Never used  Substance Use Topics   Alcohol use: No   Drug use: Yes    Types: Marijuana     Allergies   Patient has no known allergies.   Review of Systems Review of Systems  Constitutional:  Negative for chills and fever.  HENT:  Positive for ear pain, sore throat and trouble swallowing. Negative for congestion, dental problem, drooling, mouth sores, rhinorrhea, sinus pain and voice change.   Eyes:  Negative for pain and visual disturbance.  Respiratory:  Negative for cough, shortness of breath and wheezing.   Cardiovascular:  Negative for chest pain and palpitations.  Gastrointestinal:  Negative for abdominal pain, diarrhea, nausea and vomiting.  Genitourinary:  Negative for decreased urine volume, dysuria, hematuria, penile discharge, penile pain, penile  swelling, testicular pain and urgency.  Musculoskeletal:  Negative for arthralgias and back pain.  Skin:  Negative for color change and rash.  Neurological:  Negative for seizures and syncope.  All other systems reviewed and are negative.    Physical Exam Triage Vital Signs ED Triage Vitals  Enc Vitals Group     BP 06/05/22 1542 130/82     Pulse Rate 06/05/22 1542 73     Resp 06/05/22 1542 16     Temp 06/05/22 1542 100.3 F (37.9 C)     Temp Source 06/05/22 1542 Oral     SpO2 06/05/22 1542 97 %     Weight --      Height --      Head Circumference --      Peak Flow --      Pain Score  06/05/22 1549 8     Pain Loc --      Pain Edu? --      Excl. in GC? --    No data found.  Updated Vital Signs BP 130/82 (BP Location: Right Arm)   Pulse 73   Temp 100.3 F (37.9 C) (Oral)   Resp 16   SpO2 97%   Visual Acuity Right Eye Distance:   Left Eye Distance:   Bilateral Distance:    Right Eye Near:   Left Eye Near:    Bilateral Near:     Physical Exam Vitals and nursing note reviewed.  Constitutional:      General: He is not in acute distress.    Appearance: Normal appearance. He is not ill-appearing.  HENT:     Head: Normocephalic and atraumatic.     Right Ear: Tympanic membrane and ear canal normal.     Left Ear: Tympanic membrane and ear canal normal.     Nose: No congestion or rhinorrhea.     Mouth/Throat:     Dentition: Normal dentition. No dental tenderness or dental caries.     Tongue: No lesions.     Pharynx: Posterior oropharyngeal erythema present. No pharyngeal swelling, oropharyngeal exudate or uvula swelling.     Tonsils: Tonsillar exudate present. No tonsillar abscesses. 2+ on the right. 0 on the left.  Eyes:     General: No scleral icterus.    Extraocular Movements: Extraocular movements intact.     Conjunctiva/sclera: Conjunctivae normal.  Cardiovascular:     Rate and Rhythm: Normal rate and regular rhythm.     Heart sounds: No murmur heard. Pulmonary:     Effort: Pulmonary effort is normal. No respiratory distress.     Breath sounds: Normal breath sounds. No wheezing or rales.  Musculoskeletal:     Cervical back: Normal range of motion. No rigidity.  Skin:    Capillary Refill: Capillary refill takes less than 2 seconds.     Coloration: Skin is not jaundiced.     Findings: No rash.  Neurological:     General: No focal deficit present.     Mental Status: He is alert and oriented to person, place, and time.     Motor: No weakness.     Gait: Gait normal.  Psychiatric:        Mood and Affect: Mood normal.        Behavior: Behavior  normal.      UC Treatments / Results  Labs (all labs ordered are listed, but only abnormal results are displayed) Labs Reviewed  POCT RAPID STREP A (OFFICE)  CYTOLOGY, (ORAL, ANAL, URETHRAL) ANCILLARY ONLY  EKG   Radiology No results found.  Procedures Procedures (including critical care time)  Medications Ordered in UC Medications - No data to display  Initial Impression / Assessment and Plan / UC Course  I have reviewed the triage vital signs and the nursing notes.  Pertinent labs & imaging results that were available during my care of the patient were reviewed by me and considered in my medical decision making (see chart for details).     Take medication as prescribed  Lab results will be available via mychart in 2 - 5 days Final Clinical Impressions(s) / UC Diagnoses   Final diagnoses:  Acute tonsillitis, unspecified etiology  Screen for STD (sexually transmitted disease)     Discharge Instructions      Take medication as prescribed  Lab results will be available via mychart in 2 - 5 days    ED Prescriptions     Medication Sig Dispense Auth. Provider   clindamycin (CLEOCIN) 300 MG capsule Take 1 capsule (300 mg total) by mouth 3 (three) times daily for 7 days. 21 capsule Evern Core, PA-C      PDMP not reviewed this encounter.   Evern Core, PA-C 06/05/22 1639

## 2022-06-07 LAB — CYTOLOGY, (ORAL, ANAL, URETHRAL) ANCILLARY ONLY
Chlamydia: NEGATIVE
Comment: NEGATIVE
Comment: NEGATIVE
Comment: NORMAL
Neisseria Gonorrhea: NEGATIVE
Trichomonas: NEGATIVE

## 2022-07-02 ENCOUNTER — Other Ambulatory Visit: Payer: Self-pay

## 2022-07-02 ENCOUNTER — Ambulatory Visit
Admission: RE | Admit: 2022-07-02 | Discharge: 2022-07-02 | Disposition: A | Discharge status: Home / Self Care | Payer: Medicaid Other | Source: Ambulatory Visit | Attending: Urgent Care

## 2022-07-02 VITALS — BP 126/82 | HR 86 | Temp 99.3°F | Resp 18

## 2022-07-02 DIAGNOSIS — F17210 Nicotine dependence, cigarettes, uncomplicated: Secondary | ICD-10-CM | POA: Insufficient documentation

## 2022-07-02 DIAGNOSIS — B349 Viral infection, unspecified: Secondary | ICD-10-CM | POA: Diagnosis not present

## 2022-07-02 DIAGNOSIS — Z1152 Encounter for screening for COVID-19: Secondary | ICD-10-CM | POA: Diagnosis not present

## 2022-07-02 MED ORDER — PAXLOVID (300/100) 20 X 150 MG & 10 X 100MG PO TBPK: ORAL_TABLET | ORAL | 0 refills | Status: AC

## 2022-07-02 MED ORDER — PSEUDOEPHEDRINE HCL 60 MG PO TABS: 60.0000 mg | ORAL_TABLET | Freq: Three times a day (TID) | ORAL | 0 refills | Status: AC | PRN

## 2022-07-02 MED ORDER — BENZONATATE 100 MG PO CAPS: 100.0000 mg | ORAL_CAPSULE | Freq: Three times a day (TID) | ORAL | 0 refills | Status: AC | PRN

## 2022-07-02 MED ORDER — CETIRIZINE HCL 10 MG PO TABS: 10.0000 mg | ORAL_TABLET | Freq: Every day | ORAL | 0 refills | Status: AC

## 2022-07-02 MED ORDER — PROMETHAZINE-DM 6.25-15 MG/5ML PO SYRP: 5.0000 mL | ORAL_SOLUTION | Freq: Three times a day (TID) | ORAL | 0 refills | Status: AC | PRN

## 2022-07-02 NOTE — ED Provider Notes (Signed)
[facility]  Note:  This document was prepared using Systems analyst and may include unintentional dictation errors.  MRN: [id number] DOB: 12/24/1995  Subjective:   Kerry Butler is a 27 y.o. male presenting for 4 day history of acute onset coughing, body aches, malaise, fatigue, loss of taste, sinus congestion. Has not had chest pain, shob, wheezing. Smokes 2-3 cigarettes, also smokes marijuana. No vaping.   No current facility-administered medications for this encounter. No current outpatient medications on file.   No Known Allergies  Past Medical History:  Diagnosis Date   Fracture of metacarpal 09/07/2013   right 2nd     Past Surgical History:  Procedure Laterality Date   OPEN REDUCTION INTERNAL FIXATION (ORIF) METACARPAL Right 09/17/2013   Procedure: Open reduction internal fixation right second metacarpal fracture;  Surgeon: Jolyn Nap, MD;  Location: [facility];  Service: Orthopedics;  Laterality: Right;    Family History  Problem Relation Age of Onset   Hypertension Maternal Grandmother     Social History   Tobacco Use   Smoking status: Every Day    Packs/day: 0.15    Types: Cigarettes   Smokeless tobacco: Never  Vaping Use   Vaping Use: Never used  Substance Use Topics   Alcohol use: No   Drug use: Yes    Types: Marijuana    ROS   Objective:   Vitals: BP 126/82 (BP Location: Right Arm)   Pulse 86   Temp 99.3 F (37.4 C) (Oral)   Resp 18   SpO2 98%   Physical Exam Constitutional:      General: He is not in acute distress.    Appearance: Normal appearance. He is well-developed and normal weight. He is not ill-appearing, toxic-appearing or diaphoretic.  HENT:     Head: Normocephalic and atraumatic.     Right Ear: External ear normal.     Left Ear: External ear normal.     Nose: Nose normal.     Mouth/Throat:     Mouth: Mucous membranes are moist.  Eyes:     General: No  scleral icterus.       Right eye: No discharge.        Left eye: No discharge.     Extraocular Movements: Extraocular movements intact.  Cardiovascular:     Rate and Rhythm: Normal rate and regular rhythm.     Heart sounds: Normal heart sounds. No murmur heard.    No friction rub. No gallop.  Pulmonary:     Effort: Pulmonary effort is normal. No respiratory distress.     Breath sounds: Normal breath sounds. No stridor. No wheezing, rhonchi or rales.  Musculoskeletal:     Cervical back: Normal range of motion.  Neurological:     Mental Status: He is alert and oriented to person, place, and time.  Psychiatric:        Mood and Affect: Mood normal.        Behavior: Behavior normal.        Thought Content: Thought content normal.        Judgment: Judgment normal.     Assessment and Plan :   PDMP not reviewed this encounter.  1. Acute viral syndrome     Patient requested Paxlovid for clinical diagnosis of COVID-19.  I was not opposed to this especially given his symptoms and timeline of his illness.  That is I do not want to miss the window for him to  take Paxlovid and have it be effective.  Deferred imaging given clear cardiopulmonary exam, hemodynamically stable vital signs.  Use supportive care otherwise.  Stop Paxlovid if his test result is negative. Counseled patient on potential for adverse effects with medications prescribed/recommended today, ER and return-to-clinic precautions discussed, patient verbalized understanding.    Jaynee Eagles, PA-C 07/02/22 1006

## 2022-07-02 NOTE — Discharge Instructions (Addendum)
We will notify you of your test results as they arrive and may take between about 24 hours.  I encourage you to sign up for MyChart if you have not already done so as this can be the easiest way for [place] to communicate results to you online or through a phone app.  Generally, we only contact you if it is a positive test result.  In the meantime, if you develop worsening symptoms including fever, chest pain, shortness of breath despite our current treatment plan then please report to the emergency room as this may be a sign of worsening status from possible viral infection.  Otherwise, we will manage this as a viral syndrome like COVID 19 by starting Paxlovid. If your test results positive then finish the 5 day course. If it is negative then stop taking this. For sore throat or cough try using a honey-based tea. Use 3 teaspoons of honey with juice squeezed from half lemon. Place shaved pieces of ginger into 1/2-1 cup of water and warm over stove top. Then mix the ingredients and repeat every 4 hours as needed. Please take Tylenol 500mg -650mg  every 6 hours for aches and pains, fevers. Hydrate very well with at least 2 liters of water. Eat light meals such as soups to replenish electrolytes and soft fruits, veggies. Start an antihistamine like Zyrtec for postnasal drainage, sinus congestion.  You can take this together with pseudoephedrine (Sudafed) at a dose of 60 mg 2-3 times a day as needed for the same kind of congestion.  Use the cough medications as needed.

## 2022-07-02 NOTE — ED Triage Notes (Signed)
Pt c/o cough, body aches, URI sx, loss of taste x 4 days-NAD-steady gait

## 2022-07-03 LAB — SARS CORONAVIRUS 2 (TAT 6-24 HRS): SARS Coronavirus 2: NEGATIVE

## 2022-09-07 ENCOUNTER — Ambulatory Visit: Admit: 2022-09-07 | Discharge status: Against Medical Advice | Payer: Medicaid Other

## 2022-09-08 ENCOUNTER — Ambulatory Visit
Admission: EM | Admit: 2022-09-08 | Discharge: 2022-09-08 | Disposition: A | Discharge status: Home / Self Care | Payer: Medicaid Other | Attending: Nurse Practitioner | Admitting: Nurse Practitioner

## 2022-09-08 VITALS — BP 120/68 | HR 103 | Temp 99.1°F | Resp 16

## 2022-09-08 DIAGNOSIS — N489 Disorder of penis, unspecified: Secondary | ICD-10-CM | POA: Diagnosis not present

## 2022-09-08 DIAGNOSIS — R300 Dysuria: Secondary | ICD-10-CM | POA: Diagnosis not present

## 2022-09-08 DIAGNOSIS — Z202 Contact with and (suspected) exposure to infections with a predominantly sexual mode of transmission: Secondary | ICD-10-CM | POA: Insufficient documentation

## 2022-09-08 DIAGNOSIS — Z113 Encounter for screening for infections with a predominantly sexual mode of transmission: Secondary | ICD-10-CM | POA: Diagnosis not present

## 2022-09-08 LAB — POCT URINALYSIS DIP (MANUAL ENTRY)
Bilirubin, UA: NEGATIVE
Glucose, UA: NEGATIVE mg/dL
Nitrite, UA: NEGATIVE
Protein Ur, POC: 30 mg/dL — AB
Spec Grav, UA: >=1.03 — AB (ref 1.010–1.025)
Urobilinogen, UA: 0.2 E.U./dL
pH, UA: 5.5 (ref 5.0–8.0)

## 2022-09-08 MED ORDER — CEFTRIAXONE SODIUM 500 MG IJ SOLR
500.0000 mg | INTRAMUSCULAR | Status: DC
  Administered 2022-09-08: 500 mg via INTRAMUSCULAR

## 2022-09-08 NOTE — ED Triage Notes (Signed)
Patient reports he has seen black specs on his penis and one that is scabbed over. The patient c/o pain with urination and denies other symptoms. He reports he was exposed to gonorrhea.

## 2022-09-08 NOTE — Discharge Instructions (Signed)
The clinic will contact you with results of any of the testing done today if positive Please follow-up with your PCP if your symptoms do not improve Please go to the emergency room for any worsening symptoms

## 2022-09-08 NOTE — ED Provider Notes (Signed)
[facility]    CSN: [id number] Arrival date & time: 09/08/22  1039      History   Chief Complaint Chief Complaint  Patient presents with   Exposure to STD    HPI Kerry Butler is a 27 y.o. male presents for evaluation of STD testing.  Patient reports 2 days of dysuria that has since resolved.  He also endorses a penile bump to his distal penis that is nonpainful or pruritic 4 days ago.  He denies any penile discharge, testicular pain or swelling, fevers or chills.  He states he was exposed to gonorrhea.  Denies history of HSV or RPR.  No history of UTIs.  No other concerns at this time   Exposure to STD    Past Medical History:  Diagnosis Date   Fracture of metacarpal 09/07/2013   right 2nd    Patient Active Problem List   Diagnosis Date Noted   Lab test positive for detection of COVID-19 virus 04/14/2020   Acute nasopharyngitis 04/11/2020    Past Surgical History:  Procedure Laterality Date   OPEN REDUCTION INTERNAL FIXATION (ORIF) METACARPAL Right 09/17/2013   Procedure: Open reduction internal fixation right second metacarpal fracture;  Surgeon: Jolyn Nap, MD;  Location: [facility];  Service: Orthopedics;  Laterality: Right;       Home Medications    Prior to Admission medications   Medication Sig Start Date End Date Taking? Authorizing Provider  benzonatate (TESSALON) 100 MG capsule Take 1 capsule (100 mg total) by mouth 3 (three) times daily as needed for cough. 07/02/22   Jaynee Eagles, PA-C  cetirizine (ZYRTEC ALLERGY) 10 MG tablet Take 1 tablet (10 mg total) by mouth daily. 07/02/22   Jaynee Eagles, PA-C  nirmatrelvir & ritonavir (PAXLOVID, 300/100,) 20 x 150 MG & 10 x '100MG'$  TBPK Take 2 tablets nirmtrelvir and 1 tablet ritonavir twice daily. 07/02/22   Jaynee Eagles, PA-C  promethazine-dextromethorphan (PROMETHAZINE-DM) 6.25-15 MG/5ML syrup Take 5 mLs by mouth 3 (three) times daily as needed for cough. 07/02/22   Jaynee Eagles, PA-C   pseudoephedrine (SUDAFED) 60 MG tablet Take 1 tablet (60 mg total) by mouth every 8 (eight) hours as needed for congestion. 07/02/22   Jaynee Eagles, PA-C    Family History Family History  Problem Relation Age of Onset   Hypertension Maternal Grandmother     Social History Social History   Tobacco Use   Smoking status: Every Day    Packs/day: 0.15    Types: Cigarettes   Smokeless tobacco: Never  Vaping Use   Vaping Use: Never used  Substance Use Topics   Alcohol use: No   Drug use: Yes    Types: Marijuana     Allergies   Patient has no known allergies.   Review of Systems Review of Systems  Genitourinary:  Positive for dysuria and genital sores.     Physical Exam Triage Vital Signs ED Triage Vitals  Enc Vitals Group     BP 09/08/22 1050 120/68     Pulse Rate 09/08/22 1050 (!) 103     Resp 09/08/22 1050 16     Temp 09/08/22 1050 99.1 F (37.3 C)     Temp Source 09/08/22 1050 Oral     SpO2 09/08/22 1050 96 %     Weight --      Height --      Head Circumference --      Peak Flow --      Pain  Score 09/08/22 1048 0     Pain Loc --      Pain Edu? --      Excl. in [place]? --    No data found.  Updated Vital Signs BP 120/68 (BP Location: Right Arm)   Pulse (!) 103   Temp 99.1 F (37.3 C) (Oral)   Resp 16   SpO2 96%   Visual Acuity Right Eye Distance:   Left Eye Distance:   Bilateral Distance:    Right Eye Near:   Left Eye Near:    Bilateral Near:     Physical Exam Vitals and nursing note reviewed. Chaperone present: Child psychotherapist.  Constitutional:      Appearance: Normal appearance.  HENT:     Head: Normocephalic and atraumatic.  Eyes:     Pupils: Pupils are equal, round, and reactive to light.  Cardiovascular:     Rate and Rhythm: Normal rate.  Pulmonary:     Effort: Pulmonary effort is normal.  Genitourinary:    Penis: Circumcised.        Comments: Single erythematous lesion to the distal penis.  Nontender to palpation.  No vesicles or  ulcerations. Skin:    General: Skin is warm and dry.  Neurological:     General: No focal deficit present.     Mental Status: He is alert and oriented to person, place, and time.  Psychiatric:        Mood and Affect: Mood normal.        Behavior: Behavior normal.      UC Treatments / Results  Labs (all labs ordered are listed, but only abnormal results are displayed) Labs Reviewed  POCT URINALYSIS DIP (MANUAL ENTRY) - Abnormal; Notable for the following components:      Result Value   Ketones, POC UA small (15) (*)    Spec Grav, UA >=1.030 (*)    Blood, UA trace-intact (*)    Protein Ur, POC =30 (*)    Leukocytes, UA Trace (*)    All other components within normal limits  URINE CULTURE  HSV CULTURE AND TYPING  RPR  HIV ANTIBODY (ROUTINE TESTING W REFLEX)  CYTOLOGY, (ORAL, ANAL, URETHRAL) ANCILLARY ONLY    EKG   Radiology No results found.  Procedures Procedures (including critical care time)  Medications Ordered in UC Medications  cefTRIAXone (ROCEPHIN) injection 500 mg (has no administration in time range)    Initial Impression / Assessment and Plan / UC Course  I have reviewed the triage vital signs and the nursing notes.  Pertinent labs & imaging results that were available during my care of the patient were reviewed by me and considered in my medical decision making (see chart for details).     Reviewed exam and symptoms with patient. UA with trace leuks and blood, will culture as patient is currently asymptomatic IM ceftriaxone in clinic given exposure to gonorrhea.  Patient monitored for 10 minutes after injection with no reaction noted and tolerated well STD testing is ordered and will contact for any positive results Rest and fluids PCP follow-up if symptoms do not improve ER precautions reviewed and patient verbalized understanding Final Clinical Impressions(s) / UC Diagnoses   Final diagnoses:  Dysuria  Exposure to gonorrhea  Screening  examination for STD (sexually transmitted disease)  Penile lesion     Discharge Instructions      The clinic will contact you with results of any of the testing done today if positive Please follow-up with your PCP  if your symptoms do not improve Please go to the emergency room for any worsening symptoms     ED Prescriptions   None    PDMP not reviewed this encounter.   Melynda Ripple, NP 09/08/22 1143

## 2022-09-09 LAB — CYTOLOGY, (ORAL, ANAL, URETHRAL) ANCILLARY ONLY
Chlamydia: NEGATIVE
Comment: NEGATIVE
Comment: NEGATIVE
Comment: NORMAL
Neisseria Gonorrhea: POSITIVE — AB
Trichomonas: NEGATIVE

## 2022-09-09 LAB — RPR: RPR Ser Ql: NONREACTIVE

## 2022-09-09 LAB — URINE CULTURE: Culture: NO GROWTH

## 2022-09-09 LAB — HIV ANTIBODY (ROUTINE TESTING W REFLEX): HIV Screen 4th Generation wRfx: NONREACTIVE

## 2022-09-12 LAB — HSV CULTURE AND TYPING

## 2022-09-16 ENCOUNTER — Telehealth: Payer: Self-pay

## 2022-09-16 NOTE — Telephone Encounter (Signed)
Mychart msg sent

## 2022-12-04 ENCOUNTER — Ambulatory Visit: Payer: Medicaid Other

## 2023-03-22 ENCOUNTER — Ambulatory Visit: Payer: Medicaid Other

## 2023-04-27 ENCOUNTER — Ambulatory Visit: Payer: Medicaid Other
